# Patient Record
Sex: Female | Born: 1985 | Race: White | Hispanic: No | Marital: Single | Smoking: Current every day smoker
Health system: Southern US, Community
[De-identification: ages and names within clinical notes are randomized; demographics above are authoritative.]

## PROBLEM LIST (undated history)

## (undated) DIAGNOSIS — A549 Gonococcal infection, unspecified: Secondary | ICD-10-CM

## (undated) DIAGNOSIS — F909 Attention-deficit hyperactivity disorder, unspecified type: Secondary | ICD-10-CM

## (undated) DIAGNOSIS — F429 Obsessive-compulsive disorder, unspecified: Secondary | ICD-10-CM

## (undated) HISTORY — DX: Attention-deficit hyperactivity disorder, unspecified type: F90.9

## (undated) HISTORY — DX: Obsessive-compulsive disorder, unspecified: F42.9

---
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
## 2007-01-04 ENCOUNTER — Emergency Department (HOSPITAL_COMMUNITY): Admission: EM | Admit: 2007-01-04 | Discharge: 2007-01-04 | Payer: Self-pay | Admitting: Emergency Medicine

## 2007-01-13 ENCOUNTER — Emergency Department (HOSPITAL_COMMUNITY): Admission: EM | Admit: 2007-01-13 | Discharge: 2007-01-13 | Payer: Self-pay | Admitting: Emergency Medicine

## 2007-08-05 ENCOUNTER — Emergency Department (HOSPITAL_COMMUNITY): Admission: EM | Admit: 2007-08-05 | Discharge: 2007-08-05 | Payer: Self-pay | Admitting: Emergency Medicine

## 2007-09-16 ENCOUNTER — Emergency Department (HOSPITAL_COMMUNITY): Admission: EM | Admit: 2007-09-16 | Discharge: 2007-09-16 | Payer: Self-pay | Admitting: Family Medicine

## 2008-05-20 ENCOUNTER — Encounter: Payer: Self-pay | Admitting: Family Medicine

## 2008-05-20 ENCOUNTER — Ambulatory Visit: Payer: Self-pay | Admitting: Family Medicine

## 2008-05-20 DIAGNOSIS — F17200 Nicotine dependence, unspecified, uncomplicated: Secondary | ICD-10-CM | POA: Insufficient documentation

## 2008-05-20 DIAGNOSIS — F988 Other specified behavioral and emotional disorders with onset usually occurring in childhood and adolescence: Secondary | ICD-10-CM | POA: Insufficient documentation

## 2008-05-20 LAB — CONVERTED CEMR LAB

## 2008-05-21 ENCOUNTER — Telehealth (INDEPENDENT_AMBULATORY_CARE_PROVIDER_SITE_OTHER): Payer: Self-pay | Admitting: *Deleted

## 2008-06-05 ENCOUNTER — Encounter: Payer: Self-pay | Admitting: Family Medicine

## 2008-06-12 ENCOUNTER — Encounter: Payer: Self-pay | Admitting: Family Medicine

## 2008-07-10 ENCOUNTER — Telehealth: Payer: Self-pay | Admitting: *Deleted

## 2008-09-15 ENCOUNTER — Telehealth: Payer: Self-pay | Admitting: Family Medicine

## 2008-09-16 ENCOUNTER — Encounter: Payer: Self-pay | Admitting: Family Medicine

## 2008-09-16 ENCOUNTER — Ambulatory Visit: Payer: Self-pay | Admitting: Family Medicine

## 2008-09-16 LAB — CONVERTED CEMR LAB
Antibody Screen: NEGATIVE
Basophils Absolute: 0 10*3/uL (ref 0.0–0.1)
Beta hcg, urine, semiquantitative: POSITIVE
Eosinophils Relative: 1 % (ref 0–5)
HCT: 37.6 % (ref 36.0–46.0)
Hemoglobin: 12.6 g/dL (ref 12.0–15.0)
Lymphocytes Relative: 23 % (ref 12–46)
Lymphs Abs: 1.5 10*3/uL (ref 0.7–4.0)
Monocytes Absolute: 0.3 10*3/uL (ref 0.1–1.0)
Monocytes Relative: 5 % (ref 3–12)
Neutro Abs: 4.6 10*3/uL (ref 1.7–7.7)
RBC: 4.18 M/uL (ref 3.87–5.11)
RDW: 13.3 % (ref 11.5–15.5)
Rh Type: POSITIVE
Rubella: 17.5 intl units/mL — ABNORMAL HIGH
Sickle Cell Screen: NEGATIVE

## 2008-09-19 ENCOUNTER — Inpatient Hospital Stay (HOSPITAL_COMMUNITY): Admission: AD | Admit: 2008-09-19 | Discharge: 2008-09-19 | Payer: Self-pay | Admitting: Obstetrics & Gynecology

## 2008-09-22 ENCOUNTER — Encounter: Payer: Self-pay | Admitting: *Deleted

## 2008-09-26 ENCOUNTER — Telehealth: Payer: Self-pay | Admitting: *Deleted

## 2008-09-26 ENCOUNTER — Ambulatory Visit: Payer: Self-pay | Admitting: Family Medicine

## 2008-09-26 ENCOUNTER — Encounter: Payer: Self-pay | Admitting: Family Medicine

## 2008-10-22 ENCOUNTER — Encounter: Payer: Self-pay | Admitting: *Deleted

## 2008-10-22 ENCOUNTER — Ambulatory Visit: Payer: Self-pay | Admitting: Family Medicine

## 2008-10-27 ENCOUNTER — Encounter: Payer: Self-pay | Admitting: Family Medicine

## 2008-10-27 ENCOUNTER — Ambulatory Visit: Payer: Self-pay | Admitting: Family Medicine

## 2008-11-12 ENCOUNTER — Telehealth (INDEPENDENT_AMBULATORY_CARE_PROVIDER_SITE_OTHER): Payer: Self-pay | Admitting: *Deleted

## 2008-11-12 ENCOUNTER — Encounter: Payer: Self-pay | Admitting: Family Medicine

## 2008-11-20 ENCOUNTER — Ambulatory Visit: Payer: Self-pay | Admitting: Family Medicine

## 2008-11-24 ENCOUNTER — Ambulatory Visit (HOSPITAL_COMMUNITY): Admission: RE | Admit: 2008-11-24 | Discharge: 2008-11-24 | Payer: Self-pay | Admitting: Orthopedic Surgery

## 2008-11-27 ENCOUNTER — Ambulatory Visit: Payer: Self-pay | Admitting: Obstetrics & Gynecology

## 2008-12-05 ENCOUNTER — Ambulatory Visit: Payer: Self-pay | Admitting: Obstetrics & Gynecology

## 2008-12-05 ENCOUNTER — Other Ambulatory Visit: Payer: Self-pay | Admitting: Obstetrics and Gynecology

## 2008-12-05 LAB — CONVERTED CEMR LAB

## 2008-12-10 ENCOUNTER — Inpatient Hospital Stay (HOSPITAL_COMMUNITY): Admission: AD | Admit: 2008-12-10 | Discharge: 2008-12-14 | Payer: Self-pay | Admitting: Obstetrics & Gynecology

## 2008-12-10 ENCOUNTER — Ambulatory Visit: Payer: Self-pay | Admitting: Family Medicine

## 2008-12-13 ENCOUNTER — Encounter: Payer: Self-pay | Admitting: Family Medicine

## 2009-01-07 ENCOUNTER — Emergency Department (HOSPITAL_COMMUNITY): Admission: EM | Admit: 2009-01-07 | Discharge: 2009-01-07 | Payer: Self-pay | Admitting: Emergency Medicine

## 2009-01-14 ENCOUNTER — Ambulatory Visit: Payer: Self-pay | Admitting: Family Medicine

## 2009-01-14 DIAGNOSIS — F411 Generalized anxiety disorder: Secondary | ICD-10-CM | POA: Insufficient documentation

## 2009-01-14 DIAGNOSIS — O039 Complete or unspecified spontaneous abortion without complication: Secondary | ICD-10-CM | POA: Insufficient documentation

## 2009-01-14 DIAGNOSIS — N883 Incompetence of cervix uteri: Secondary | ICD-10-CM | POA: Insufficient documentation

## 2009-01-19 ENCOUNTER — Telehealth: Payer: Self-pay | Admitting: Family Medicine

## 2009-01-23 ENCOUNTER — Ambulatory Visit: Payer: Self-pay | Admitting: Family Medicine

## 2009-01-28 ENCOUNTER — Telehealth: Payer: Self-pay | Admitting: Family Medicine

## 2009-02-20 ENCOUNTER — Ambulatory Visit: Payer: Self-pay | Admitting: Family Medicine

## 2009-02-26 ENCOUNTER — Encounter: Payer: Self-pay | Admitting: Family Medicine

## 2009-02-28 ENCOUNTER — Emergency Department (HOSPITAL_COMMUNITY): Admission: EM | Admit: 2009-02-28 | Discharge: 2009-02-28 | Payer: Self-pay | Admitting: Emergency Medicine

## 2009-03-03 ENCOUNTER — Encounter: Payer: Self-pay | Admitting: *Deleted

## 2009-03-10 ENCOUNTER — Ambulatory Visit: Payer: Self-pay | Admitting: Family Medicine

## 2009-03-23 ENCOUNTER — Ambulatory Visit: Payer: Self-pay | Admitting: Family Medicine

## 2009-03-23 DIAGNOSIS — G4700 Insomnia, unspecified: Secondary | ICD-10-CM | POA: Insufficient documentation

## 2009-03-23 DIAGNOSIS — B360 Pityriasis versicolor: Secondary | ICD-10-CM

## 2009-04-10 ENCOUNTER — Encounter: Payer: Self-pay | Admitting: *Deleted

## 2009-04-24 ENCOUNTER — Ambulatory Visit: Payer: Self-pay | Admitting: Family Medicine

## 2009-04-24 LAB — CONVERTED CEMR LAB: Beta hcg, urine, semiquantitative: NEGATIVE

## 2009-05-04 ENCOUNTER — Emergency Department (HOSPITAL_COMMUNITY): Admission: EM | Admit: 2009-05-04 | Discharge: 2009-05-04 | Payer: Self-pay | Admitting: Family Medicine

## 2009-05-04 ENCOUNTER — Telehealth: Payer: Self-pay | Admitting: Family Medicine

## 2009-05-25 ENCOUNTER — Telehealth: Payer: Self-pay | Admitting: *Deleted

## 2009-06-24 ENCOUNTER — Telehealth: Payer: Self-pay | Admitting: Family Medicine

## 2009-07-08 ENCOUNTER — Encounter: Payer: Self-pay | Admitting: *Deleted

## 2009-07-08 ENCOUNTER — Encounter: Payer: Self-pay | Admitting: Family Medicine

## 2009-07-30 ENCOUNTER — Encounter (INDEPENDENT_AMBULATORY_CARE_PROVIDER_SITE_OTHER): Payer: Self-pay

## 2009-08-06 ENCOUNTER — Telehealth: Payer: Self-pay | Admitting: Family Medicine

## 2010-07-19 ENCOUNTER — Ambulatory Visit: Payer: Self-pay | Admitting: Family Medicine

## 2010-08-25 ENCOUNTER — Telehealth: Payer: Self-pay | Admitting: Family Medicine

## 2010-09-24 ENCOUNTER — Ambulatory Visit: Payer: Self-pay

## 2010-10-17 ENCOUNTER — Emergency Department (HOSPITAL_COMMUNITY)
Admission: EM | Admit: 2010-10-17 | Discharge: 2010-10-17 | Payer: Self-pay | Source: Home / Self Care | Admitting: Emergency Medicine

## 2010-11-16 NOTE — Progress Notes (Signed)
Summary: refill  Phone Note Refill Request Call back at [phone] Message from:  Patient  Refills Requested: Medication #1:  ADDERALL 5 MG TABS three times a day [BMN]. Please call when ready  Initial call taken by: De Nurse,  August 25, 2010 2:11 PM    Prescriptions: ADDERALL 5 MG TABS (AMPHETAMINE-DEXTROAMPHETAMINE) three times a day  #90 x 0   Entered and Authorized by:   Luretha Murphy NP   Signed by:   Luretha Murphy NP on 08/25/2010   Method used:   Handwritten   RxID:   [id number]

## 2010-11-16 NOTE — Assessment & Plan Note (Signed)
Summary: f/up,tcb   Vital Signs:  Patient profile:   25 year old female Height:      62 inches Weight:      114.8 pounds BMI:     21.07 Pulse rate:   90 / minute BP sitting:   140 / 90  (right arm)  Vitals Entered By: Arlyss Repress CMA, (July 19, 2010 1:43 PM) CC: refill meds. Is Patient Diabetic? No Pain Assessment Patient in pain? no        Primary Care Provider:  Jamie Brookes MD  CC:  refill meds..  History of Present Illness: Tracy Reyes reports that she is at [employer] and [employer], that she plans to go to a 4 year college.  Her student loans keep her away from her family home that she describes as the [place].  She lives with other college students.  She reports good grades.  She does not have a payer source for her health care and thus has not been in.  She has been buying stimulant meds from others to help her study.  She reports she needs it to feel normal and stay focused.  She has been formally evaluated with scores consisted with ADD.  She discusses her family, is hopeless on her Mother, she is worried about younger siblings, and wants Herbert Seta to come back to [place] (her husband was deported).  She denies sexual contacts for over one year.  Habits & Providers  Alcohol-Tobacco-Diet     Tobacco Status: current     Tobacco Counseling: to quit use of tobacco products     Cigarette Packs/Day: 0.5  Current Medications (verified): 1)  Adderall 5 Mg Tabs (Amphetamine-Dextroamphetamine) .... Three Times A Day  Allergies: No Known Drug Allergies  Social History: Packs/Day:  0.5  Review of Systems      See HPI  Physical Exam  General:  Well-developed,well-nourished,in no acute distress; alert,appropriate and cooperative throughout examination Psych:  Oriented X3, memory intact for recent and remote, normally interactive, good eye contact, not anxious appearing, not depressed appearing, and not agitated.     Impression & Recommendations:  Problem # 1:   ATTENTION DEFICIT DISORDER (ICD-314.00) refilled Adderall #90 of short acting 5 mg, she must come in every 3 months for continued refills. Orders: [facility]- Est Level  3 (11914)  Complete Medication List: 1)  Adderall 5 Mg Tabs (Amphetamine-dextroamphetamine) .... Three times a day  Other Orders: Influenza Vaccine NON MCR (78295) Tdap => 31yrs IM (62130) Admin 1st Vaccine (86578) Admin 1st Vaccine [facility]) [phone])  Patient Instructions: 1)  Rudell Cobb information 2)  return in 3 months Prescriptions: ADDERALL 5 MG TABS (AMPHETAMINE-DEXTROAMPHETAMINE) three times a day Brand medically necessary #90 x 0   Entered and Authorized by:   Luretha Murphy NP   Signed by:   Luretha Murphy NP on 07/19/2010   Method used:   Print then Give to Patient   RxID:   [id number]    Prevention & Chronic Care Immunizations   Influenza vaccine: Fluvax Non-MCR  (07/19/2010)    Tetanus booster: 07/19/2010: Tdap    Pneumococcal vaccine: Not documented  Other Screening   Pap smear: NEGATIVE FOR INTRAEPITHELIAL LESIONS OR MALIGNANCY.  (10/27/2008)   Pap smear due: 06/05/2009   Smoking status: current  (07/19/2010)   Smoking cessation counseling: yes  (10/27/2008)   Tetanus/Td Vaccine    Vaccine Type: Tdap    Site: left deltoid    Mfr: boostrix    Dose: 0.5 ml    Route:  IM    Given by: Arlyss Repress CMA,    Exp. Date: 07/07/2012    Lot #: [id number]    VIS given: 09/03/08 version given July 19, 2010.  Influenza Vaccine    Vaccine Type: Fluvax Non-MCR    Site: left deltoid    Mfr: GlaxoSmithKline    Dose: 0.5 ml    Route: IM    Given by: Arlyss Repress CMA,    Exp. Date: 05/13/2011    Lot #: [id number]    VIS given: 05/11/10 version given July 19, 2010.  Flu Vaccine Consent Questions    Do you have a history of severe allergic reactions to this vaccine? no    Any prior history of allergic reactions to egg and/or gelatin? no    Do you have a sensitivity to the preservative  Thimersol? no    Do you have a past history of Guillan-Barre Syndrome? no    Do you currently have an acute febrile illness? no    Have you ever had a severe reaction to latex? no    Vaccine information given and explained to patient? yes    Are you currently pregnant? no

## 2010-11-24 ENCOUNTER — Encounter: Payer: Self-pay | Admitting: *Deleted

## 2011-01-25 LAB — URINALYSIS, ROUTINE W REFLEX MICROSCOPIC
Bilirubin Urine: NEGATIVE
Ketones, ur: NEGATIVE mg/dL
Nitrite: NEGATIVE
Protein, ur: 100 mg/dL — AB
pH: 6 (ref 5.0–8.0)

## 2011-01-25 LAB — URINE MICROSCOPIC-ADD ON

## 2011-01-25 LAB — ETHANOL: Alcohol, Ethyl (B): 177 mg/dL — ABNORMAL HIGH (ref 0–10)

## 2011-01-25 LAB — POCT PREGNANCY, URINE: Preg Test, Ur: NEGATIVE

## 2011-01-27 LAB — POCT RAPID STREP A (OFFICE): Streptococcus, Group A Screen (Direct): NEGATIVE

## 2011-02-01 LAB — DIFFERENTIAL
Basophils Absolute: 0 10*3/uL (ref 0.0–0.1)
Eosinophils Absolute: 0 10*3/uL (ref 0.0–0.7)
Lymphs Abs: 2 10*3/uL (ref 0.7–4.0)
Neutrophils Relative %: 85 % — ABNORMAL HIGH (ref 43–77)

## 2011-02-01 LAB — CBC
HCT: 37.7 % (ref 36.0–46.0)
Hemoglobin: 12.7 g/dL (ref 12.0–15.0)
MCHC: 33.7 g/dL (ref 30.0–36.0)
MCV: 93.3 fL (ref 78.0–100.0)
MCV: 94.3 fL (ref 78.0–100.0)
Platelets: 197 10*3/uL (ref 150–400)
RBC: 4.04 MIL/uL (ref 3.87–5.11)
RDW: 13.3 % (ref 11.5–15.5)
WBC: 14.8 10*3/uL — ABNORMAL HIGH (ref 4.0–10.5)
WBC: 17.2 10*3/uL — ABNORMAL HIGH (ref 4.0–10.5)

## 2011-02-01 LAB — POCT URINALYSIS DIP (DEVICE)
Glucose, UA: NEGATIVE mg/dL
Ketones, ur: NEGATIVE mg/dL
Protein, ur: NEGATIVE mg/dL
Protein, ur: NEGATIVE mg/dL
Specific Gravity, Urine: 1.02 (ref 1.005–1.030)
Specific Gravity, Urine: 1.02 (ref 1.005–1.030)
Urobilinogen, UA: 0.2 mg/dL (ref 0.0–1.0)
Urobilinogen, UA: 0.2 mg/dL (ref 0.0–1.0)
pH: 6.5 (ref 5.0–8.0)

## 2011-02-01 LAB — URINALYSIS, ROUTINE W REFLEX MICROSCOPIC
Bilirubin Urine: NEGATIVE
Glucose, UA: NEGATIVE mg/dL
Ketones, ur: 15 mg/dL — AB
Leukocytes, UA: NEGATIVE
Protein, ur: NEGATIVE mg/dL
pH: 7 (ref 5.0–8.0)

## 2011-02-01 LAB — STREP B DNA PROBE

## 2011-03-01 NOTE — Discharge Summary (Signed)
NAMEKIZZI, OVERBEY                  ACCOUNT NO.:  [id number]   MEDICAL RECORD NO.:  [id number]          PATIENT TYPE:  INP   LOCATION:  9161                          [facility]ILITY:  [facility]   PHYSICIAN:  Tanya S. Shawnie Pons, M.D.   DATE OF BIRTH:  1986/01/14   DATE OF ADMISSION:  12/10/2008  DATE OF DISCHARGE:  12/14/2008                               DISCHARGE SUMMARY   REASON FOR HOSPITALIZATION:  The patient was admitted on December 10, 2008, with advanced preterm labor, presented at 5-6 cm with bulging bag  of water.   PERTINENT LABORATORY DATA:  White blood cells 17.2 and hemoglobin 11.9.  UA negative and positive fetal fibronectin.   FINAL DIAGNOSES:  1. Preterm delivery on December 13, 2008.  2. Breech presentation.   SIGNIFICANT FINDINGS:  The patient was dated with ultrasound criteria  with a 10-week ultrasound that was performed on September 19, 2008.  Based  on the 2-week ultrasound, the patient's [facility] was April 16, 2009.  The  patient was found to have an uncertain LMP.   PROCEDURES PERFORMED AND TREATMENT RENDERED:  The patient was admitted,  placed in Trendelenburg position, given pain medications including  Dilaudid PCA plus Ativan secondary to increased anxiety throughout the  whole labor process.  She did have an MFM and NICU consult and plan was  to recommend no tocolysis and no steroids until the patient reached 22  in 5 days.  The patient proceeded to have increased contractions and  complete dilatation on December 13, 2008, at 22 weeks and 2 days.  The  fetus was at breech presentation.  The large bag of water intact,  delivered; however, fetus was not in the sac.  Due to increased  pressure, bag of water ruptured with fetus still midway between the  cervix.  The patient was given Cytotec to help facilitate delivery,  proceeded with delivery at 2230.  NICU was present due to the patient's  request, delivery of a nonviable female eyes fused, cord clamped x2,  infant to the  warmer.  NICU confirmed the fetus was born dead.  Placenta  was spontaneous and intact with three-vessel cord.  EBL 250 mL.  Anesthesia, an epidural and fundus was firm above pubis.   CONDITION OF THE PATIENT ON DISCHARGE:  The patient tearful with vital  signs stable.  Decreased bleeding, reporting decreased pain, denies any  suicidal ideation.   Instructions given to the patient and her family, the patient was given  reading packet with information related to crisis number for increased  grief following a fetal death.  Instructions related to physical  activity, medication, diet,  and followup.  The patient was known to able to return to normal  physical activity, have increased bleeding.  Prescription was given for  a Tylenol #3 and ibuprofen for pain as well as Ativan for anxiety.  The  patient is to follow up in the [facility] in 4 weeks for  assessment of status and discuss future pregnancies.      Sid Falcon, CNM  Shelbie Proctor. Shawnie Pons, M.D.  Electronically Signed    WM/MEDQ  D:  12/14/2008  T:  12/14/2008  Job:  [id number]

## 2011-03-04 NOTE — Discharge Summary (Signed)
NAMEVIVICA, DOBOSZ                  ACCOUNT NO.:  [id number]   MEDICAL RECORD NO.:  [id number]          PATIENT TYPE:  INP   LOCATION:  9305                          [facility]ILITY:  [facility]   PHYSICIAN:  Phil D. Okey Dupre, M.D.     DATE OF BIRTH:  1986-07-20   DATE OF ADMISSION:  03/28/2006  DATE OF DISCHARGE:  03/29/2006                                 DISCHARGE SUMMARY   DISCHARGE DIAGNOSES:  1.  Preterm delivery at 24 and 1 with fetal death.  2.  THC positive.  3.  BD.  4.  Smoking addiction.   DISCHARGE LABS:  Urine drug screen positive for THC, GC/chlamydia was  negative.  The patient's blood type is AB positive.  Wet prep showed a few  clue cells, WBCs and moderate bacteria.  Urinalysis showed specific gravity  of 1.020 and greater than 80  _________ but was otherwise negative.   PROCEDURES:  None.   DISCHARGE MEDICATIONS:  1.  Ibuprofen 600 mg q.6h p.r.n.  2.  Flagyl 500 mg p.o. b.i.d. for 7 days.  3.  Ambien 10 mg 1/2 pill prior to bedtime, take second half if the first      half does not work.  4.  We will discuss birth control pills at an appointment with Dr. Gavin Potters      is on Friday at 10:30 a.m.   HOSPITAL SUMMARY:  1.  This is a 25 year old female, G1 P52 00 who came in at 24 weeks with      preterm labor.  She was found to be 680 with a bulging bag at -2-3.  The      patient was given betamethasone and magnesium sulfate along with      __________.  Though precautions were taken, the patient went on to      deliver a non-viable fetus on March 29, 2006 at 2 a.m. when she was 26      and 1.  The patient had no lacerations.  The patient did not receive any      medications for pain.  The patient was grieving after delivery.  She had      minimal walk in.  She will discuss birth control with Dr. Gavin Potters at her      next appointment.  2.  Positive drug screen.  Given the emotional situation at this time it was      felt that it was not an appropriate time to discuss use of this,  however      it will be discussed at her appointment with Dr. Gavin Potters.  3.  BD.  The patient was given a prescription for Flagyl on discharge.   DISPOSITION:   DISCHARGE MEDICATIONS:   SUMMARY:      Rolm Gala, M.D.    ______________________________  Javier Glazier. Okey Dupre, M.D.    Bennetta Laos  D:  03/29/2006  T:  03/29/2006  Job:  [id number]

## 2011-07-27 LAB — CBC
HCT: 37.2
Hemoglobin: 12.7
RBC: 4.09
RDW: 12.9
WBC: 11.1 — ABNORMAL HIGH

## 2011-07-27 LAB — WET PREP, GENITAL
Trich, Wet Prep: NONE SEEN
Yeast Wet Prep HPF POC: NONE SEEN

## 2011-07-27 LAB — DIFFERENTIAL
Basophils Absolute: 0
Basophils Relative: 0
Eosinophils Absolute: 0
Monocytes Absolute: 0.8 — ABNORMAL HIGH
Monocytes Relative: 7
Neutro Abs: 9.2 — ABNORMAL HIGH
Neutrophils Relative %: 82 — ABNORMAL HIGH

## 2011-07-27 LAB — URINE MICROSCOPIC-ADD ON

## 2011-07-27 LAB — GC/CHLAMYDIA PROBE AMP, GENITAL: Chlamydia, DNA Probe: POSITIVE — AB

## 2011-07-27 LAB — COMPREHENSIVE METABOLIC PANEL
ALT: 11
Alkaline Phosphatase: 42
BUN: 5 — ABNORMAL LOW
Chloride: 103
Glucose, Bld: 106 — ABNORMAL HIGH
Potassium: 3.3 — ABNORMAL LOW
Sodium: 134 — ABNORMAL LOW
Total Bilirubin: 0.9
Total Protein: 6.6

## 2011-07-27 LAB — URINALYSIS, ROUTINE W REFLEX MICROSCOPIC
Glucose, UA: NEGATIVE
Ketones, ur: 15 — AB
Protein, ur: 30 — AB
Urobilinogen, UA: 1

## 2011-07-27 LAB — POCT PREGNANCY, URINE
Operator id: 151321
Preg Test, Ur: NEGATIVE

## 2011-07-27 LAB — STREP A DNA PROBE: Group A Strep Probe: NEGATIVE

## 2011-07-27 LAB — URINE CULTURE

## 2011-09-23 ENCOUNTER — Ambulatory Visit (INDEPENDENT_AMBULATORY_CARE_PROVIDER_SITE_OTHER): Payer: Self-pay | Admitting: Family Medicine

## 2011-09-23 ENCOUNTER — Inpatient Hospital Stay (HOSPITAL_COMMUNITY): Payer: Self-pay

## 2011-09-23 ENCOUNTER — Encounter: Payer: Self-pay | Admitting: Family Medicine

## 2011-09-23 ENCOUNTER — Encounter (HOSPITAL_COMMUNITY): Payer: Self-pay

## 2011-09-23 ENCOUNTER — Inpatient Hospital Stay (HOSPITAL_COMMUNITY)
Admission: AD | Admit: 2011-09-23 | Discharge: 2011-09-23 | Disposition: A | Discharge status: Home / Self Care | Payer: Self-pay | Source: Ambulatory Visit | Attending: Obstetrics & Gynecology | Admitting: Obstetrics & Gynecology

## 2011-09-23 VITALS — BP 134/79 | HR 97 | Temp 98.7°F | Ht 61.0 in | Wt 119.0 lb

## 2011-09-23 DIAGNOSIS — O209 Hemorrhage in early pregnancy, unspecified: Secondary | ICD-10-CM | POA: Insufficient documentation

## 2011-09-23 DIAGNOSIS — N920 Excessive and frequent menstruation with regular cycle: Secondary | ICD-10-CM

## 2011-09-23 DIAGNOSIS — N912 Amenorrhea, unspecified: Secondary | ICD-10-CM

## 2011-09-23 HISTORY — DX: Gonococcal infection, unspecified: A54.9

## 2011-09-23 LAB — POCT URINE PREGNANCY: Preg Test, Ur: POSITIVE

## 2011-09-23 LAB — CBC
MCH: 31.2 pg (ref 26.0–34.0)
MCHC: 34.3 g/dL (ref 30.0–36.0)
MCV: 91 fL (ref 78.0–100.0)
Platelets: 175 10*3/uL (ref 150–400)
RBC: 4.58 MIL/uL (ref 3.87–5.11)
RDW: 13.3 % (ref 11.5–15.5)

## 2011-09-23 LAB — HCG, QUANTITATIVE, PREGNANCY: hCG, Beta Chain, Quant, S: 237 m[IU]/mL — ABNORMAL HIGH (ref <5)

## 2011-09-23 LAB — WET PREP, GENITAL

## 2011-09-23 MED ORDER — NORGESTIMATE-ETH ESTRADIOL 0.25-35 MG-MCG PO TABS: 1.0000 | ORAL_TABLET | Freq: Every day | ORAL | Status: DC

## 2011-09-23 NOTE — Progress Notes (Signed)
Patient states she started bleeding on 11-20 and has been bleeding like a period since. Has mild back cramping. Was seen at [facility] today and had a positive urine pregnancy test and was sent to [facility] for further evaluation.

## 2011-09-23 NOTE — Progress Notes (Signed)
  Subjective:    Patient ID: Tracy Reyes, female    DOB: 09-30-1986, 25 y.o.   MRN: [id number]  HPI Regular periods until last one started November 20th, on time, has had heavy flow since then.  Has not slowed. No abdominal pain.  No dysuria. No contraception, sexually active  Declines pelvic exam due to heavy flow  Positive pregnancy test from lab  PMH history for cervical incompetence and two miscarraiges. Review of Systems     Objective:   Physical Exam GEN: Alert & Oriented, No acute distress, pressured speech, hypomanic even before discussing pregnancy. CV:  Regular Rate & Rhythm, no murmur Respiratory:  Normal work of breathing, CTAB Abd:  + BS, soft, no tenderness to palpation         Assessment & Plan:

## 2011-09-23 NOTE — Assessment & Plan Note (Signed)
Positive pregnancy test today.  Hgb and vitals stable.  Will send to [facility] to further evaluation.  I suspect miscarriage.  Friend and boyfriend here with her, will ensure compliance.
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
## 2011-09-23 NOTE — Progress Notes (Signed)
Patient is here with c/o vaginal bleeding with confirmed pregnancy at [facility]. Patient has a history of 2 over 5months fetal losses. She is anxious about this. She states that she had an normal mentrual period in October. She started bleeding on nov. 20th and continued till today. She was concerned about the long period that she called her doctor on Wednesday. Her appt was today and pregnancy was confirmed. She has a quarter size red spot in the pad she came in with which was placed about 1830pm. She is c/o constant lower back pain and frequency in urination. Denies any cramping.

## 2011-09-23 NOTE — Patient Instructions (Addendum)
Your pregnancy test is positive Because you are bleeding, I would like you to go to [facility] tonight for evaluation  _________________________________________________________________________________________________________   ABCs of Pregnancy A Antepartum care is very important. Be sure you see your doctor and get prenatal care as soon as you think you are pregnant. At this time, you will be tested for infection, genetic abnormalities and potential problems with you and the pregnancy. This is the time to discuss diet, exercise, work, medications, labor, pain medication during labor and the possibility of a cesarean delivery. Ask any questions that may concern you. It is important to see your doctor regularly throughout your pregnancy. Avoid exposure to toxic substances and chemicals - such as cleaning solvents, lead and mercury, some insecticides, and paint. Pregnant women should avoid exposure to paint fumes, and fumes that cause you to feel ill, dizzy or faint. When possible, it is a good idea to have a pre-pregnancy consultation with your caregiver to begin some important recommendations your caregiver suggests such as, taking folic acid, exercising, quitting smoking, avoiding alcoholic beverages, etc. B Breastfeeding is the healthiest choice for both you and your baby. It has many nutritional benefits for the baby and health benefits for the mother. It also creates a very tight and loving bond between the baby and mother. Talk to your doctor, your family and friends, and your employer about how you choose to feed your baby and how they can support you in your decision. Not all birth defects can be prevented, but a woman can take actions that may increase her chance of having a healthy baby. Many birth defects happen very early in pregnancy, sometimes before a woman even knows she is pregnant. Birth defects or abnormalities of any child in your or the father's family should be discussed with  your caregiver. Get a good support bra as your breast size changes. Wear it especially when you exercise and when nursing.   C Celebrate the news of your pregnancy with the your spouse/father and family. Childbirth classes are helpful to take for you and the spouse/father because it helps to understand what happens during the pregnancy, labor and delivery. Cesarean delivery should be discussed with your doctor so you are prepared for that possibility. The pros and cons of circumcision if it is a boy, should be discussed with your pediatrician. Cigarette smoking during pregnancy can result in low birth weight babies. It has been associated with infertility, miscarriages, tubal pregnancies, infant death (mortality) and poor health (morbidity) in childhood. Additionally, cigarette smoking may cause long-term learning disabilities. If you smoke, you should try to quit before getting pregnant and not smoke during the pregnancy. Secondary smoke may also harm a mother and her developing baby. It is a good idea to ask people to stop smoking around you during your pregnancy and after the baby is born. Extra calcium is necessary when you are pregnant and is found in your prenatal vitamin, in dairy products, green leafy vegetables and in calcium supplements. D A healthy diet according to your current weight and height, along with vitamins and mineral supplements should be discussed with your caregiver. Domestic abuse or violence should be made known to your doctor right away to get the situation corrected. Drink more water when you exercise to keep hydrated. Discomfort of your back and legs usually develops and progresses from the middle of the second trimester through to delivery of the baby. This is because of the enlarging baby and uterus, which may also  affect your balance. Do not take illegal drugs. Illegal drugs can seriously harm the baby and you. Drink extra fluids (water is best) throughout pregnancy to help your  body keep up with the increases in your blood volume. Drink at least 6 to 8 glasses of water, fruit juice, or milk each day. A good way to know you are drinking enough fluid is when your urine looks almost like clear water or is very light yellow.   E Eat healthy to get the nutrients you and your unborn baby need. Your meals should include the five basic food groups. Exercise (30 minutes of light to moderate exercise a day) is important and encouraged during pregnancy, if there are no medical problems or problems with the pregnancy. Exercise that causes discomfort or dizziness should be stopped and reported to your caregiver. Emotions during pregnancy can change from being ecstatic to depression and should be understood by you, your partner and your family. F Fetal screening with ultrasound, amniocentesis and monitoring during pregnancy and labor is common and sometimes necessary. Take 400 micrograms of folic acid daily both before, when possible, and during the first few months of pregnancy to reduce the risk of birth defects of the brain and spine. All women who could possibly become pregnant should take a vitamin with folic acid, every day. It is also important to eat a healthy diet with fortified foods (enriched grain products, including cereals, rice, breads, and pastas) and foods with natural sources of folate (orange juice, green leafy vegetables, beans, peanuts, broccoli, asparagus, peas, and lentils). The father should be involved with all aspects of the pregnancy including, the prenatal care, childbirth classes, labor, delivery, and postpartum time. Fathers may also have emotional concerns about being a father, financial needs, and raising a family. G Genetic testing should be done appropriately. It is important to know your family and the father's history. If there have been problems with pregnancies or birth defects in your family, report these to your doctor. Also, genetic counselors can talk with  you about the information you might need in making decisions about having a family. You can call a major medical center in your area for help in finding a board-certified genetic counselor. Genetic testing and counseling should be done before pregnancy when possible, especially if there is a history of problems in the mother's or father's family. Certain ethnic backgrounds are more at risk for genetic defects. H Get familiar with the hospital where you will be having your baby. Get to know how long it takes to get there, the labor and delivery area, and the hospital procedures. Be sure your medical insurance is accepted there. Get your home ready for the baby including, clothes, the baby's room (when possible), furniture and car seat. Hand washing is important throughout the day, especially after handling raw meat and poultry, changing the baby's diaper or using the bathroom. This can help prevent the spread of many bacteria and viruses that cause infection. Your hair may become dry and thinner, but will return to normal a few weeks after the baby is born. Heartburn is a common problem that can be treated by taking antacids recommended by your caregiver, eating smaller meals 5 or 6 times a day, not drinking liquids when eating, drinking between meals and raising the head of your bed 2 to 3 inches. I Insurance to cover you, the baby, doctor and hospital should be reviewed so that you will be prepared to pay any costs not covered by  your insurance plan. If you do not have medical insurance, there are usually clinics and services available for you in your community. Take 30 milligrams of iron during your pregnancy as prescribed by your doctor to reduce the risk of low red blood cells (anemia) later in pregnancy. All women of childbearing age should eat a diet rich in iron. J There should be a joint effort for the mother, father and any other children to adapt to the pregnancy financially, emotionally, and  psychologically during the pregnancy. Join a support group for moms-to-be. Or, join a class on parenting or childbirth. Have the family participate when possible. K Know your limits. Let your caregiver know if you experience any of the following:    Pain of any kind.     Strong cramps.     You develop a lot of weight in a short period of time (5 pounds in 3 to 5 days).     Vaginal bleeding, leaking of amniotic fluid.     Headache, vision problems.     Dizziness, fainting, shortness of breath.     Chest pain.     Fever of 102 F (38.9 C) or higher.     Gush of clear fluid from your vagina.     Painful urination.     Domestic violence.     Irregular heartbeat (palpitations).     Rapid beating of the heart (tachycardia).     Constant feeling sick to your stomach (nauseous) and vomiting.     Trouble walking, fluid retention (edema).     Muscle weakness.     If your baby has decreased activity.     Persistent diarrhea.     Abnormal vaginal discharge.     Uterine contractions at 20-minute intervals.     Back pain that travels down your leg.  L Learn and practice that what you eat and drink should be in moderation and healthy for you and your baby. Legal drugs such as alcohol and caffeine are important issues for pregnant women. There is no safe amount of alcohol a woman can drink while pregnant. Fetal alcohol syndrome, a disorder characterized by growth retardation, facial abnormalities, and central nervous system dysfunction, is caused by a woman's use of alcohol during pregnancy. Caffeine, found in tea, coffee, soft drinks and chocolate, should also be limited. Be sure to read labels when trying to cut down on caffeine during pregnancy. More than 200 foods, beverages, and over-the-counter medications contain caffeine and have a high salt content! There are coffees and teas that do not contain caffeine. M Medical conditions such as diabetes, epilepsy, and high blood  pressure should be treated and kept under control before pregnancy when possible, but especially during pregnancy. Ask your caregiver about any medications that may need to be changed or adjusted during pregnancy. If you are currently taking any medications, ask your caregiver if it is safe to take them while you are pregnant or before getting pregnant when possible. Also, be sure to discuss any herbs or vitamins you are taking. They are medicines, too! Discuss with your doctor all medications, prescribed and over-the-counter, that you are taking. During your prenatal visit, discuss the medications your doctor may give you during labor and delivery. N Never be afraid to ask your doctor or caregiver questions about your health, the progress of the pregnancy, family problems, stressful situations, and recommendation for a pediatrician, if you do not have one. It is better to take all precautions and discuss any  questions or concerns you may have during your office visits. It is a good idea to write down your questions before you visit the doctor. O Over-the-counter cough and cold remedies may contain alcohol or other ingredients that should be avoided during pregnancy. Ask your caregiver about prescription, herbs or over-the-counter medications that you are taking or may consider taking while pregnant.   P Physical activity during pregnancy can benefit both you and your baby by lessening discomfort and fatigue, providing a sense of well-being, and increasing the likelihood of early recovery after delivery. Light to moderate exercise during pregnancy strengthens the belly (abdominal) and back muscles. This helps improve posture. Practicing yoga, walking, swimming, and cycling on a stationary bicycle are usually safe exercises for pregnant women. Avoid scuba diving, exercise at high altitudes (over 3000 feet), skiing, horseback riding, contact sports, etc. Always check with your doctor before beginning any kind of  exercise, especially during pregnancy and especially if you did not exercise before getting pregnant. Q Queasiness, stomach upset and morning sickness are common during pregnancy. Eating a couple of crackers or dry toast before getting out of bed. Foods that you normally love may make you feel sick to your stomach. You may need to substitute other nutritious foods. Eating 5 or 6 small meals a day instead of 3 large ones may make you feel better. Do not drink with your meals, drink between meals. Questions that you have should be written down and asked during your prenatal visits. R Read about and make plans to baby-proof your home. There are important tips for making your home a safer environment for your baby. Review the tips and make your home safer for you and your baby. Read food labels regarding calories, salt and fat content in the food. S Saunas, hot tubs, and steam rooms should be avoided while you are pregnant. Excessive high heat may be harmful during your pregnancy. Your caregiver will screen and examine you for sexually transmitted diseases and genetic disorders during your prenatal visits. Learn the signs of labor. Sexual relations while pregnant is safe unless there is a medical or pregnancy problem and your caregiver advises against it. T Traveling long distances should be avoided especially in the third trimester of your pregnancy. If you do have to travel out of state, be sure to take a copy of your medical records and medical insurance plan with you. You should not travel long distances without seeing your doctor first. Most airlines will not allow you to travel after 36 weeks of pregnancy. Toxoplasmosis is an infection caused by a parasite that can seriously harm an unborn baby. Avoid eating undercooked meat and handling cat litter. Be sure to wear gloves when gardening. Tingling of the hands and fingers is not unusual and is due to fluid retention. This will go away after the baby is  born. U Womb (uterus) size increases during the first trimester. Your kidneys will begin to function more efficiently. This may cause you to feel the need to urinate more often. You may also leak urine when sneezing, coughing or laughing. This is due to the growing uterus pressing against your bladder, which lies directly in front of and slightly under the uterus during the first few months of pregnancy. If you experience burning along with frequency of urination or bloody urine, be sure to tell your doctor. The size of your uterus in the third trimester may cause a problem with your balance. It is advisable to maintain good posture and  avoid wearing high heels during this time. An ultrasound of your baby may be necessary during your pregnancy and is safe for you and your baby. V Vaccinations are an important concern for pregnant women. Get needed vaccines before pregnancy. Center for Disease Control ([employer]) has clear guidelines for the use of vaccines during pregnancy. Review the list, be sure to discuss it with your doctor. Prenatal vitamins are helpful and healthy for you and the baby. Do not take extra vitamins except what is recommended. Taking too much of certain vitamins can cause overdose problems. Continuous vomiting should be reported to your caregiver. Varicose veins may appear especially if there is a family history of varicose veins. They should subside after the delivery of the baby. Support hose helps if there is leg discomfort. W Being overweight or underweight during pregnancy may cause problems. Try to get within 15 pounds of your ideal weight before pregnancy. Remember, pregnancy is not a time to be dieting! Do not stop eating or start skipping meals as your weight increases. Both you and your baby need the calories and nutrition you receive from a healthy diet. Be sure to consult with your doctor about your diet. There is a formula and diet plan available depending on whether you are  overweight or underweight. Your caregiver or nutritionist can help and advise you if necessary. X Avoid X-rays. If you must have dental work or diagnostic tests, tell your dentist or physician that you are pregnant so that extra care can be taken. X-rays should only be taken when the risks of not taking them outweigh the risk of taking them. If needed, only the minimum amount of radiation should be used. When X-rays are necessary, protective lead shields should be used to cover areas of the body that are not being X-rayed. Y Your baby loves you. Breastfeeding your baby creates a loving and very close bond between the two of you. Give your baby a healthy environment to live in while you are pregnant. Infants and children require constant care and guidance. Their health and safety should be carefully watched at all times. After the baby is born, rest or take a nap when the baby is sleeping. Z Get your ZZZs. Be sure to get plenty of rest. Resting on your side as often as possible, especially on your left side is advised. It provides the best circulation to your baby and helps reduce swelling. Try taking a nap for 30 to 45 minutes in the afternoon when possible. After the baby is born rest or take a nap when the baby is sleeping. Try elevating your feet for that amount of time when possible. It helps the circulation in your legs and helps reduce swelling.   Most information courtesy of the CDC. Document Released: 10/03/2005 Document Revised: 06/15/2011 Document Reviewed: 06/17/2009 [facility] Patient Information 2012 [place], [place].

## 2011-09-26 ENCOUNTER — Inpatient Hospital Stay (HOSPITAL_COMMUNITY)
Admission: AD | Admit: 2011-09-26 | Discharge: 2011-09-26 | Disposition: A | Discharge status: Home / Self Care | Payer: Self-pay | Source: Ambulatory Visit | Attending: Obstetrics & Gynecology | Admitting: Obstetrics & Gynecology

## 2011-09-26 ENCOUNTER — Encounter (HOSPITAL_COMMUNITY): Payer: Self-pay | Admitting: *Deleted

## 2011-09-26 DIAGNOSIS — O209 Hemorrhage in early pregnancy, unspecified: Secondary | ICD-10-CM | POA: Insufficient documentation

## 2011-09-26 LAB — HCG, QUANTITATIVE, PREGNANCY: hCG, Beta Chain, Quant, S: 279 m[IU]/mL — ABNORMAL HIGH (ref <5)

## 2011-09-26 NOTE — Plan of Care (Signed)
Patient is not in the lobby when called to triage.  

## 2011-09-26 NOTE — Progress Notes (Signed)
Patient to MAU for repeat BHCG. Patient denies any pain but does have slight bleeding.
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
## 2011-09-28 ENCOUNTER — Inpatient Hospital Stay (HOSPITAL_COMMUNITY): Payer: Self-pay

## 2011-09-28 ENCOUNTER — Inpatient Hospital Stay (HOSPITAL_COMMUNITY)
Admission: AD | Admit: 2011-09-28 | Discharge: 2011-09-28 | Disposition: A | Discharge status: Home / Self Care | Payer: Self-pay | Source: Ambulatory Visit | Attending: Obstetrics and Gynecology | Admitting: Obstetrics and Gynecology

## 2011-09-28 ENCOUNTER — Encounter (HOSPITAL_COMMUNITY): Payer: Self-pay | Admitting: *Deleted

## 2011-09-28 DIAGNOSIS — O00109 Unspecified tubal pregnancy without intrauterine pregnancy: Secondary | ICD-10-CM | POA: Insufficient documentation

## 2011-09-28 DIAGNOSIS — O009 Unspecified ectopic pregnancy without intrauterine pregnancy: Secondary | ICD-10-CM

## 2011-09-28 LAB — COMPREHENSIVE METABOLIC PANEL
ALT: 6 U/L (ref 0–35)
Calcium: 10.5 mg/dL (ref 8.4–10.5)
Creatinine, Ser: 0.6 mg/dL (ref 0.50–1.10)
GFR calc Af Amer: >90 mL/min (ref >90)
Glucose, Bld: 96 mg/dL (ref 70–99)
Sodium: 139 mEq/L (ref 135–145)
Total Protein: 7.3 g/dL (ref 6.0–8.3)

## 2011-09-28 LAB — CBC
Hemoglobin: 14 g/dL (ref 12.0–15.0)
MCH: 31.4 pg (ref 26.0–34.0)
MCHC: 33.9 g/dL (ref 30.0–36.0)
MCV: 92.6 fL (ref 78.0–100.0)
Platelets: 170 10*3/uL (ref 150–400)

## 2011-09-28 MED ORDER — METHOTREXATE INJECTION FOR WOMEN'S HOSPITAL
50.0000 mg/m2 | Freq: Once | INTRAMUSCULAR | Status: AC
  Administered 2011-09-28: 75 mg via INTRAMUSCULAR
  Filled 2011-09-28: qty 1.5

## 2011-09-28 NOTE — Progress Notes (Signed)
Pt here for repeat bhcg only, denies pain at present, does have light pink bleeding.
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
## 2011-10-01 ENCOUNTER — Encounter (HOSPITAL_COMMUNITY): Payer: Self-pay | Admitting: Advanced Practice Midwife

## 2011-10-01 ENCOUNTER — Inpatient Hospital Stay (HOSPITAL_COMMUNITY)
Admission: AD | Admit: 2011-10-01 | Discharge: 2011-10-01 | Disposition: A | Discharge status: Home / Self Care | Payer: Self-pay | Source: Ambulatory Visit | Attending: Obstetrics and Gynecology | Admitting: Obstetrics and Gynecology

## 2011-10-01 DIAGNOSIS — O00109 Unspecified tubal pregnancy without intrauterine pregnancy: Secondary | ICD-10-CM | POA: Insufficient documentation

## 2011-10-01 DIAGNOSIS — O009 Unspecified ectopic pregnancy without intrauterine pregnancy: Secondary | ICD-10-CM | POA: Diagnosis present
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
## 2011-10-01 NOTE — Progress Notes (Signed)
Pt reports no bleeding and no pain today. Had cramping last 2 days but none today

## 2011-10-02 NOTE — ED Provider Notes (Signed)
Agree with above note.  Tracy Reyes 10/02/2011 7:47 AM   

## 2011-10-04 NOTE — ED Provider Notes (Signed)
Agree with above note.  Tracy Reyes 10/04/2011 1:23 PM

## 2011-10-05 ENCOUNTER — Encounter (HOSPITAL_COMMUNITY): Payer: Self-pay | Admitting: *Deleted

## 2011-10-05 ENCOUNTER — Inpatient Hospital Stay (HOSPITAL_COMMUNITY)
Admission: AD | Admit: 2011-10-05 | Discharge: 2011-10-05 | Disposition: A | Discharge status: Home / Self Care | Payer: Self-pay | Source: Ambulatory Visit | Attending: Obstetrics & Gynecology | Admitting: Obstetrics & Gynecology

## 2011-10-05 DIAGNOSIS — O00109 Unspecified tubal pregnancy without intrauterine pregnancy: Secondary | ICD-10-CM | POA: Insufficient documentation

## 2011-10-05 DIAGNOSIS — O009 Unspecified ectopic pregnancy without intrauterine pregnancy: Secondary | ICD-10-CM

## 2011-10-05 LAB — HCG, QUANTITATIVE, PREGNANCY: hCG, Beta Chain, Quant, S: 372 m[IU]/mL — ABNORMAL HIGH (ref <5)

## 2011-10-05 NOTE — Progress Notes (Signed)
Pt states, " I am still having a small amt of vaginal bleeding, but no pain."
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
## 2011-10-06 NOTE — ED Provider Notes (Signed)
Attestation of Attending Supervision of Advanced Practitioner: Evaluation and management procedures were performed by the PA/NP/CNM/OB Fellow under my supervision/collaboration. Chart reviewed, and agree with management and plan.  Jaynie Collins, M.D. 10/06/2011 11:30 AM

## 2011-10-07 ENCOUNTER — Ambulatory Visit: Payer: Self-pay

## 2011-10-12 ENCOUNTER — Ambulatory Visit: Payer: Self-pay

## 2011-10-24 ENCOUNTER — Ambulatory Visit: Payer: Self-pay

## 2011-10-25 ENCOUNTER — Ambulatory Visit: Payer: Self-pay

## 2012-08-20 ENCOUNTER — Ambulatory Visit: Payer: Self-pay | Admitting: Family Medicine

## 2012-11-20 ENCOUNTER — Ambulatory Visit: Payer: BC Managed Care – PPO | Admitting: Family Medicine

## 2013-05-02 ENCOUNTER — Ambulatory Visit: Payer: BC Managed Care – PPO | Admitting: Family Medicine

## 2014-08-18 ENCOUNTER — Encounter (HOSPITAL_COMMUNITY): Payer: Self-pay | Admitting: *Deleted

## 2016-01-28 ENCOUNTER — Ambulatory Visit (INDEPENDENT_AMBULATORY_CARE_PROVIDER_SITE_OTHER): Payer: Self-pay | Admitting: Obstetrics & Gynecology

## 2016-01-28 ENCOUNTER — Encounter: Payer: Self-pay | Admitting: Obstetrics & Gynecology

## 2016-01-28 VITALS — BP 129/77 | HR 81 | Ht 61.0 in | Wt 103.7 lb

## 2016-01-28 DIAGNOSIS — Z113 Encounter for screening for infections with a predominantly sexual mode of transmission: Secondary | ICD-10-CM

## 2016-01-28 DIAGNOSIS — R591 Generalized enlarged lymph nodes: Secondary | ICD-10-CM

## 2016-01-28 DIAGNOSIS — Z124 Encounter for screening for malignant neoplasm of cervix: Secondary | ICD-10-CM

## 2016-01-28 DIAGNOSIS — N760 Acute vaginitis: Secondary | ICD-10-CM

## 2016-01-28 DIAGNOSIS — R599 Enlarged lymph nodes, unspecified: Secondary | ICD-10-CM

## 2016-01-28 DIAGNOSIS — B9689 Other specified bacterial agents as the cause of diseases classified elsewhere: Secondary | ICD-10-CM

## 2016-01-28 LAB — CBC WITH DIFFERENTIAL/PLATELET
BASOS PCT: 0 %
Basophils Absolute: 0 cells/uL (ref 0–200)
EOS ABS: 75 {cells}/uL (ref 15–500)
Eosinophils Relative: 1 %
HCT: 40.4 % (ref 35.0–45.0)
Hemoglobin: 13.7 g/dL (ref 11.7–15.5)
Lymphocytes Relative: 38 %
Lymphs Abs: 2850 cells/uL (ref 850–3900)
MCH: 31.2 pg (ref 27.0–33.0)
MCHC: 33.9 g/dL (ref 32.0–36.0)
MCV: 92 fL (ref 80.0–100.0)
MONO ABS: 675 {cells}/uL (ref 200–950)
MONOS PCT: 9 %
MPV: 11.6 fL (ref 7.5–12.5)
NEUTROS ABS: 3900 {cells}/uL (ref 1500–7800)
Neutrophils Relative %: 52 %
PLATELETS: 171 10*3/uL (ref 140–400)
RBC: 4.39 MIL/uL (ref 3.80–5.10)
RDW: 13.5 % (ref 11.0–15.0)
WBC: 7.5 10*3/uL (ref 3.8–10.8)

## 2016-01-28 LAB — POCT URINALYSIS DIP (DEVICE)
Glucose, UA: NEGATIVE mg/dL
Hgb urine dipstick: NEGATIVE
Nitrite: NEGATIVE
PH: 5.5 (ref 5.0–8.0)
Protein, ur: 100 mg/dL — AB
Specific Gravity, Urine: >=1.03 (ref 1.005–1.030)
Urobilinogen, UA: 0.2 mg/dL (ref 0.0–1.0)

## 2016-01-28 MED ORDER — METRONIDAZOLE 500 MG PO TABS: 500.0000 mg | ORAL_TABLET | Freq: Two times a day (BID) | ORAL | Status: AC

## 2016-01-28 NOTE — Patient Instructions (Signed)

## 2016-01-28 NOTE — Progress Notes (Signed)
Patient ID: Tracy FinlayAmber N Reyes, female   DOB: 03/08/1986, 30 y.o.   MRN: [id number] History:  30 y.o. [id number] here today for eval of bilateral inguinal lymphadenopathy.  She reports that the sx began 2 years ago after [place]  She denies vaginal discharge or irregular bleeding.  She reports that she had weight loss initially and lost ~19#.  She reports nausea and decreased appetite that seems to have improved.  She also c/o a lump under her right breast at her bra line.   She reports that it occurs whether she wears a regular or a sports bra.    She denies F/C currently.  The following portions of the patient's history were reviewed and updated as appropriate: allergies, current medications, past family history, past medical history, past social history, past surgical history and problem list.  Review of Systems:  Pertinent items are noted in HPI.  Objective:  Physical Exam There were no vitals taken for this visit. Gen: NAD Lungs: CTA CV: RRR Breasts: symmetric bilaterally; no masses are palpable.  There is a tender area on the right side over one of the ribs in the area of concern for the pt.  The skin in intact. No skin changes and no nipple discharge is noted.  Abd: Soft, nontender and nondistended; no incisions; no rebound and no guarding  Pelvic: Normal appearing external genitalia; there is bilateral inguinal LA.  It is nontender. There is no erythema.  These are all mobile. There is normal appearing vaginal mucosa and cervix.  Normal discharge.  Small uterus, no other palpable masses, no uterine or adnexal tenderness   Assessment & Plan:  Swollen inguinal lymph nodes- etiology unknown.  Need to r/o infectious causes  CBC with diff PAP  Cervical cx BV- Flagyl 500mg  po bid x 7 days Pt to f/u in 2 weeks- rec HIV, RPR and hepatitis screen prior to that visit     Tracy Reyes, M.D., Evern CoreFACOG

## 2016-01-29 ENCOUNTER — Other Ambulatory Visit: Payer: Self-pay | Admitting: Obstetrics & Gynecology

## 2016-01-29 ENCOUNTER — Encounter: Payer: Self-pay | Admitting: Obstetrics & Gynecology

## 2016-01-29 DIAGNOSIS — R590 Localized enlarged lymph nodes: Secondary | ICD-10-CM

## 2016-01-29 LAB — WET PREP, GENITAL
TRICH WET PREP: NONE SEEN
YEAST WET PREP: NONE SEEN

## 2016-01-29 LAB — URINE CULTURE: Colony Count: 10000

## 2016-01-29 LAB — GC/CHLAMYDIA PROBE AMP (~~LOC~~) NOT AT ARMC
CHLAMYDIA, DNA PROBE: NEGATIVE
Neisseria Gonorrhea: NEGATIVE

## 2016-02-01 ENCOUNTER — Telehealth: Payer: Self-pay | Admitting: *Deleted

## 2016-02-01 LAB — CYTOLOGY - PAP

## 2016-02-01 NOTE — Telephone Encounter (Signed)
Per Dr Erin FullingHarraway-Smith, patient needs to return to have an HIV, RPR, and Hep panel. Called patient and let her know. She will come in tomorrow. She also said she noticed her next appointment is with another doctor (Constant). She wants to See Dr Erin FullingHarraway-Smith again if possible. I told her she could reschedule and request to see Dr Erin FullingHarraway-Smith. Understanding voiced. Will notify front desk to add patient to lab schedule tomorrow.

## 2016-02-03 ENCOUNTER — Other Ambulatory Visit: Payer: Self-pay

## 2016-02-04 ENCOUNTER — Other Ambulatory Visit: Payer: Self-pay

## 2016-02-08 ENCOUNTER — Other Ambulatory Visit: Payer: Self-pay

## 2016-02-08 ENCOUNTER — Telehealth: Payer: Self-pay | Admitting: Obstetrics and Gynecology

## 2016-02-08 DIAGNOSIS — R591 Generalized enlarged lymph nodes: Secondary | ICD-10-CM

## 2016-02-08 DIAGNOSIS — R590 Localized enlarged lymph nodes: Secondary | ICD-10-CM

## 2016-02-08 LAB — HIV ANTIBODY (ROUTINE TESTING W REFLEX): HIV 1&2 Ab, 4th Generation: NONREACTIVE

## 2016-02-08 LAB — HEPATITIS PANEL, ACUTE
HCV Ab: NEGATIVE
Hep A IgM: NONREACTIVE
Hep B C IgM: NONREACTIVE
Hepatitis B Surface Ag: NEGATIVE

## 2016-02-08 NOTE — Telephone Encounter (Signed)
Ordering labs per Dr. Derwood KaplanH-S instructions.

## 2016-02-09 ENCOUNTER — Telehealth: Payer: Self-pay | Admitting: Family Medicine

## 2016-02-09 DIAGNOSIS — R591 Generalized enlarged lymph nodes: Secondary | ICD-10-CM

## 2016-02-09 LAB — RPR

## 2016-02-09 NOTE — Telephone Encounter (Signed)
Calling about test results
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
## 2016-02-15 ENCOUNTER — Telehealth: Payer: Self-pay

## 2016-02-15 NOTE — Telephone Encounter (Signed)
Patient called back today she has change her mind and would like to have a referral placed to ID to discuss her lymph pain. I have placed a referral to ID.

## 2016-02-15 NOTE — Telephone Encounter (Signed)
error 

## 2016-02-17 ENCOUNTER — Encounter: Payer: Self-pay | Admitting: *Deleted

## 2016-02-17 NOTE — Progress Notes (Signed)
Patient came to clinic window asking to speak to someone. She was very anxious and stated her lymph nodes were still swollen and painful and she had a referral to ID about this but it would not be till July that she could be seen. She was at a loss as to what could be done to help her. [facility] told her they could see her sooner. She would like to have her records sent to them and see whomever and get her in first. I spoke with her at length about her anxiety. I encouraged her to see her provider about getting help for her anxiety. I also encouraged her to try not to push her family and support persons away and allow them to help and support her. She voiced understanding and appeared calmer. ROI signed. Will fax to the phone numbers she provided.

## 2016-02-22 ENCOUNTER — Ambulatory Visit: Payer: Self-pay | Admitting: Family Medicine

## 2016-02-29 ENCOUNTER — Ambulatory Visit: Payer: Self-pay | Admitting: Obstetrics and Gynecology

## 2016-03-09 ENCOUNTER — Ambulatory Visit: Payer: Self-pay | Admitting: Obstetrics & Gynecology

## 2016-03-23 ENCOUNTER — Ambulatory Visit: Payer: Self-pay | Admitting: Internal Medicine

## 2016-08-02 ENCOUNTER — Inpatient Hospital Stay (HOSPITAL_COMMUNITY): Admission: AD | Admit: 2016-08-02 | Payer: Self-pay | Source: Ambulatory Visit | Admitting: Obstetrics

## 2018-01-19 ENCOUNTER — Encounter (HOSPITAL_COMMUNITY): Payer: Self-pay

## 2018-02-22 ENCOUNTER — Ambulatory Visit: Payer: Self-pay | Admitting: Obstetrics & Gynecology

## 2020-04-10 ENCOUNTER — Other Ambulatory Visit: Payer: Self-pay

## 2020-04-10 ENCOUNTER — Encounter (HOSPITAL_COMMUNITY): Payer: Self-pay

## 2020-04-10 DIAGNOSIS — Z041 Encounter for examination and observation following transport accident: Secondary | ICD-10-CM | POA: Diagnosis not present

## 2020-04-10 DIAGNOSIS — F17210 Nicotine dependence, cigarettes, uncomplicated: Secondary | ICD-10-CM | POA: Insufficient documentation
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
## 2020-04-10 NOTE — ED Notes (Signed)
Pt called for triage. No answer x1! 

## 2020-04-11 ENCOUNTER — Encounter (HOSPITAL_COMMUNITY): Payer: Self-pay | Admitting: Student

## 2020-04-11 ENCOUNTER — Emergency Department (HOSPITAL_COMMUNITY)
Admission: EM | Admit: 2020-04-11 | Discharge: 2020-04-11 | Disposition: A | Discharge status: Home / Self Care | Payer: Medicaid Other | Attending: Emergency Medicine | Admitting: Emergency Medicine
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
## 2020-04-11 NOTE — ED Notes (Signed)
Declined DC vitals
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
## 2022-01-10 ENCOUNTER — Ambulatory Visit (HOSPITAL_COMMUNITY)
Admission: EM | Admit: 2022-01-10 | Discharge: 2022-01-10 | Disposition: A | Discharge status: Home / Self Care | Payer: Medicaid Other | Attending: Internal Medicine | Admitting: Internal Medicine

## 2022-01-10 ENCOUNTER — Ambulatory Visit (INDEPENDENT_AMBULATORY_CARE_PROVIDER_SITE_OTHER): Payer: Medicaid Other

## 2022-01-10 ENCOUNTER — Encounter (HOSPITAL_COMMUNITY): Payer: Self-pay | Admitting: Emergency Medicine

## 2022-01-10 DIAGNOSIS — S92424A Nondisplaced fracture of distal phalanx of right great toe, initial encounter for closed fracture: Secondary | ICD-10-CM

## 2022-01-10 DIAGNOSIS — S92421A Displaced fracture of distal phalanx of right great toe, initial encounter for closed fracture: Secondary | ICD-10-CM

## 2022-01-10 MED ORDER — IBUPROFEN 600 MG PO TABS: 600.0000 mg | ORAL_TABLET | Freq: Four times a day (QID) | ORAL | 0 refills | Status: AC | PRN
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
## 2022-01-10 NOTE — Discharge Instructions (Addendum)
Ibuprofen as needed for pain ?As the pain improves, do gentle range of motion exercises ?These buddy tape the first and second toe to help with pain. ?Return to urgent care if symptoms worsen. ?
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
## 2022-05-27 ENCOUNTER — Ambulatory Visit: Payer: Medicaid Other | Admitting: Podiatry
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
